# Patient Record
Sex: Female | Born: 1965 | Race: White | Hispanic: No | Marital: Married | Smoking: Former smoker
Health system: Southern US, Community
[De-identification: ages and names within clinical notes are randomized; demographics above are authoritative.]

## PROBLEM LIST (undated history)

## (undated) DIAGNOSIS — E669 Obesity, unspecified: Secondary | ICD-10-CM

## (undated) DIAGNOSIS — E785 Hyperlipidemia, unspecified: Secondary | ICD-10-CM

## (undated) HISTORY — DX: Hyperlipidemia, unspecified: E78.5

## (undated) HISTORY — DX: Obesity, unspecified: E66.9

## (undated) HISTORY — PX: FINGER GANGLION CYST EXCISION: SHX1636

---

## 1999-11-18 ENCOUNTER — Other Ambulatory Visit: Admission: RE | Admit: 1999-11-18 | Discharge: 1999-11-18 | Payer: Self-pay | Admitting: Emergency Medicine

## 2000-11-28 ENCOUNTER — Other Ambulatory Visit: Admission: RE | Admit: 2000-11-28 | Discharge: 2000-11-28 | Payer: Self-pay | Admitting: *Deleted

## 2002-06-20 ENCOUNTER — Other Ambulatory Visit: Admission: RE | Admit: 2002-06-20 | Discharge: 2002-06-20 | Payer: Self-pay | Admitting: *Deleted

## 2004-09-09 ENCOUNTER — Other Ambulatory Visit: Admission: RE | Admit: 2004-09-09 | Discharge: 2004-09-09 | Payer: Self-pay | Admitting: Family Medicine

## 2004-09-28 ENCOUNTER — Ambulatory Visit (HOSPITAL_BASED_OUTPATIENT_CLINIC_OR_DEPARTMENT_OTHER): Admission: RE | Admit: 2004-09-28 | Discharge: 2004-09-28 | Payer: Self-pay | Admitting: Surgery

## 2004-09-28 ENCOUNTER — Ambulatory Visit: Payer: Self-pay | Admitting: Internal Medicine

## 2004-09-29 ENCOUNTER — Encounter: Admission: RE | Admit: 2004-09-29 | Discharge: 2004-12-28 | Payer: Self-pay | Admitting: Surgery

## 2004-10-05 ENCOUNTER — Ambulatory Visit (HOSPITAL_COMMUNITY): Admission: RE | Admit: 2004-10-05 | Discharge: 2004-10-05 | Payer: Self-pay | Admitting: Surgery

## 2004-12-29 ENCOUNTER — Observation Stay (HOSPITAL_COMMUNITY): Admission: RE | Admit: 2004-12-29 | Discharge: 2004-12-30 | Payer: Self-pay | Admitting: Surgery

## 2004-12-29 HISTORY — PX: LAPAROSCOPIC GASTRIC BANDING: SHX1100

## 2006-09-27 HISTORY — PX: WRIST SURGERY: SHX841

## 2007-01-20 ENCOUNTER — Ambulatory Visit (HOSPITAL_COMMUNITY): Admission: RE | Admit: 2007-01-20 | Discharge: 2007-01-20 | Payer: Self-pay | Admitting: *Deleted

## 2007-09-28 HISTORY — PX: BUNIONECTOMY: SHX129

## 2008-01-23 ENCOUNTER — Ambulatory Visit (HOSPITAL_COMMUNITY): Admission: RE | Admit: 2008-01-23 | Discharge: 2008-01-23 | Payer: Self-pay | Admitting: *Deleted

## 2009-02-18 ENCOUNTER — Ambulatory Visit (HOSPITAL_COMMUNITY): Admission: RE | Admit: 2009-02-18 | Discharge: 2009-02-18 | Payer: Self-pay | Admitting: Obstetrics

## 2010-03-03 ENCOUNTER — Ambulatory Visit (HOSPITAL_COMMUNITY): Admission: RE | Admit: 2010-03-03 | Discharge: 2010-03-03 | Payer: Self-pay | Admitting: Obstetrics

## 2011-02-12 NOTE — Procedures (Signed)
NAME:  Joanna Lowe, Joanna Lowe NO.:  [id number]   MEDICAL RECORD NO.:  [id number]          PATIENT TYPE:  OUT   LOCATION:  SLEEP CENTER                 FACILITY:  [facility]   PHYSICIAN:  Clinton D. Maple Hudson, M.D. DATE OF BIRTH:  [birth year]   DATE OF STUDY:  09/28/2004                              NOCTURNAL POLYSOMNOGRAM   INDICATIONS FOR STUDY:  Hypersomnia with sleep apnea. Epworth sleepiness  score of 8/24,  neck size 15 inches, BMI 37.1, weight 210 pounds.   SLEEP ARCHITECTURE:  Short total sleep time of 277 minutes with sleep  efficiency of 67%. Stage I was 17%, stage II was 53%, stages III and IV were  10%, REM was 20% of total sleep time. Latency to sleep onset 9 minutes.  Latency to REM 79 minutes. Awake after sleep onset 102 minutes. Arousal  index 5.4. No medications were taken before sleep.   RESPIRATORY DATA:  RDI 3 obstructive events per hour. This included 14  hypopneas during the sleep time. This is within normal limits. Obstructive  events were only noted while sleeping on her back. REM RDI 13.9 per hour.   OXYGEN DATA:  Very loud snoring with oxygen desaturation to a nadir of 90%.  Mean oxygen saturation through the study was 96% on room air.   CARDIAC DATA:  Normal sinus rhythm.   MOVEMENT/PARASOMNIA:  Occasional leg jerk, insignificant.   IMPRESSION/RECOMMENDATIONS:  Occasional obstructive sleep disordered  breathing events with a frequency (RDI) of only 3 per hour which is within  normal limits for an adult and not indicative obstructive sleep apnea  syndrome. Events were noted while sleeping on her back only. Recommend  weight loss and advised to sleep off of back if appropriate.                                                           Clinton D. Maple Hudson, M.D.  Diplomate, [employer] Board   CDY/MEDQ  D:  10/04/2004 08:36:42  T:  10/04/2004 08:46:56  Job:  [id number]

## 2011-02-12 NOTE — Op Note (Signed)
Joanna Lowe, ELDEN NO.:  [id number]   MEDICAL RECORD NO.:  [id number]          PATIENT TYPE:  OBV   LOCATION:  0098                         FACILITY:  [facility]   PHYSICIAN:  Thornton Park. Daphine Deutscher, MD  DATE OF BIRTH:  1966/06/20   DATE OF PROCEDURE:  12/29/2004  DATE OF DISCHARGE:                                 OPERATIVE REPORT   PREOPERATIVE DIAGNOSIS:  Morbid obesity with body mass index right at 40.   POSTOPERATIVE DIAGNOSIS:  Morbid obesity with body mass index right at 40.   PROCEDURE:  Laparoscopic adjustable gastric banding, 10 cm.   SURGEON:  Thornton Park. Daphine Deutscher, MD   SURGEON:  Vikki Ports, MD   ANESTHESIA:  General endotracheal.   ESTIMATED BLOOD LOSS:  Negligible.   DRAINS:  None.   APPARENT COMPLICATIONS:  None.   DESCRIPTION OF PROCEDURE:  Joanna Lowe is a 45 year old lady who I had  seen and obtained informed consent____________ was obtained regarding  gastric banding.  She is brought to OR #1 and given general anesthesia.  Access of the abdomen was gained through the left upper quadrant using  Optiview technique without difficulty.  Then subsequently two ports were  placed in the left, camera placed below, and a second 5 mm laterally on the  left.  First, the left crus was identified, and an area incised.  The pars  ______flacida_______ was open and down near the fat pad where the right crus  was, I opened an area there and then passed the finger dissector bluntly,  bringing out without difficulty.  The band was then introduced through the  inferior-most port on the right, then introduced without difficulty.  It was  placed in the band passer and pulled around and threaded back through the  buckle and then snapped.  The sizer was passed per mouth and went through  the band easily and pulled back through easily.  The band was now in place,  and the buckle was pulled to the patient's right.  Then three sutures of 2-0  Surgitek were  placed using the regular needle in free-suture technique but  using the tie knot for securing them.  This imbricated the stomach back up  on the proximal stomach.  Everything seemed to be in order, and no bleeding  was noted.  The tip of the band was then brought out through the inferior-  most portal on the right.  This was then cut off and connected to the port.  This was then sutured into the fascia with three interrupted 2-0 Prolene,  securing it.  No  fluid was left in the band.  The wounds were all closed with 4-0 Vicryl and  subcuticularly with Benzoin and Steri-Strips.  The patient seemed to  tolerated the procedure well.  She was taken to the recovery room in  satisfactory condition.      MBM/MEDQ  D:  12/29/2004  T:  12/29/2004  Job:  [id number]   cc:   Holley Bouche, M.D.  [address]  [place], [place] [address]  Fax: [phone]

## 2011-03-25 ENCOUNTER — Other Ambulatory Visit (HOSPITAL_COMMUNITY): Payer: Self-pay | Admitting: Obstetrics

## 2011-03-25 DIAGNOSIS — Z1231 Encounter for screening mammogram for malignant neoplasm of breast: Secondary | ICD-10-CM

## 2011-04-09 ENCOUNTER — Ambulatory Visit (HOSPITAL_COMMUNITY)
Admission: RE | Admit: 2011-04-09 | Discharge: 2011-04-09 | Disposition: A | Discharge status: Home / Self Care | Payer: BC Managed Care – PPO | Source: Ambulatory Visit | Attending: Obstetrics | Admitting: Obstetrics

## 2011-04-09 DIAGNOSIS — Z1231 Encounter for screening mammogram for malignant neoplasm of breast: Secondary | ICD-10-CM

## 2011-05-12 ENCOUNTER — Encounter (INDEPENDENT_AMBULATORY_CARE_PROVIDER_SITE_OTHER): Payer: Self-pay | Admitting: General Surgery

## 2011-05-14 ENCOUNTER — Encounter (INDEPENDENT_AMBULATORY_CARE_PROVIDER_SITE_OTHER): Payer: Self-pay | Admitting: Surgery

## 2011-06-09 ENCOUNTER — Encounter (INDEPENDENT_AMBULATORY_CARE_PROVIDER_SITE_OTHER): Payer: Self-pay | Admitting: Surgery

## 2011-06-09 ENCOUNTER — Ambulatory Visit (INDEPENDENT_AMBULATORY_CARE_PROVIDER_SITE_OTHER): Payer: BC Managed Care – PPO | Admitting: Surgery

## 2011-06-09 DIAGNOSIS — Z9884 Bariatric surgery status: Secondary | ICD-10-CM

## 2011-06-09 NOTE — Patient Instructions (Signed)

## 2011-06-09 NOTE — Progress Notes (Signed)
Joanna Lowe comes in today and she is too tight. She requested that I remove some fluid from her band and I did. Her removed 0.2 cc from her band. I'll see her back in 3 months. In the meantime Hub is doing well

## 2011-09-10 ENCOUNTER — Encounter (INDEPENDENT_AMBULATORY_CARE_PROVIDER_SITE_OTHER): Payer: Self-pay

## 2011-09-10 ENCOUNTER — Encounter (INDEPENDENT_AMBULATORY_CARE_PROVIDER_SITE_OTHER): Payer: Self-pay | Admitting: Surgery

## 2011-09-10 ENCOUNTER — Ambulatory Visit (INDEPENDENT_AMBULATORY_CARE_PROVIDER_SITE_OTHER): Payer: BC Managed Care – PPO | Admitting: Physician Assistant

## 2011-09-10 VITALS — BP 126/84 | HR 70 | Temp 97.6°F | Resp 18 | Ht 62.0 in | Wt 184.2 lb

## 2011-09-10 DIAGNOSIS — Z4651 Encounter for fitting and adjustment of gastric lap band: Secondary | ICD-10-CM

## 2011-09-10 DIAGNOSIS — R1011 Right upper quadrant pain: Secondary | ICD-10-CM

## 2011-09-10 DIAGNOSIS — E669 Obesity, unspecified: Secondary | ICD-10-CM

## 2011-09-10 NOTE — Progress Notes (Signed)
  HISTORY: Joanna Lowe is a 46 y.o.female who received an 10cm lap-band in April 2006 by Dr. Daphine Deutscher. She comes in today complaining about increased hunger and larger portion sizes. No vomiting unless she eats too quickly. Otherwise she doesn't have symptoms of over-restriction. She does relate post-parandial RUQ pain that began earlier in the summer. She's uncertain if it's associated with nausea but she does say it's pretty much after meals. She denies history of a cholecystectomy.  VITAL SIGNS: Filed Vitals:   09/10/11 1520  BP: 126/84  Pulse: 70  Temp: 97.6 F (36.4 C)  Resp: 18    PHYSICAL EXAM: Physical exam reveals a very well-appearing 45 y.o.female in no apparent distress Neurologic: Awake, alert, oriented Psych: Bright affect, conversant Respiratory: Breathing even and unlabored. No stridor or wheezing Abdomen: Soft, nondistended to palpation. Incisions well-healed. No incisional hernias. Port easily palpated. Mild murphy's sign on deep inspiration. No hepatomegaly. No guarding. No tenderness elsewhere in the abdomen Extremities: Atraumatic, good range of motion.  ASSESMENT: 45 y.o.  female  s/p 10cm lap-band. RUQ abdominal pain.  PLAN: The patient's port was accessed with a 20G Huber needle without difficulty. Clear fluid was aspirated and 0.1 mL saline was added to the port. The patient was able to swallow water without difficulty following the procedure and was instructed to take clear liquids for the next 24-48 hours and advance slowly as tolerated. I have ordered a RUQ u/s to investigate the RUQ pain.

## 2011-09-10 NOTE — Patient Instructions (Signed)
Take clear liquids for the next 48 hours. Thin protein shakes are ok to start on Saturday evening. Call us if you have persistent vomiting or regurgitation, night cough or reflux symptoms. Return as scheduled or sooner if you notice no changes in hunger/portion sizes.  Follow-up with radiology for your ultrasound.

## 2011-09-14 ENCOUNTER — Other Ambulatory Visit (INDEPENDENT_AMBULATORY_CARE_PROVIDER_SITE_OTHER): Payer: Self-pay | Admitting: General Surgery

## 2011-09-14 DIAGNOSIS — R1011 Right upper quadrant pain: Secondary | ICD-10-CM

## 2011-09-16 ENCOUNTER — Ambulatory Visit
Admission: RE | Admit: 2011-09-16 | Discharge: 2011-09-16 | Disposition: A | Discharge status: Home / Self Care | Payer: BC Managed Care – PPO | Source: Ambulatory Visit | Attending: Physician Assistant | Admitting: Physician Assistant

## 2011-09-16 ENCOUNTER — Other Ambulatory Visit: Payer: BC Managed Care – PPO

## 2011-09-16 DIAGNOSIS — R1011 Right upper quadrant pain: Secondary | ICD-10-CM

## 2011-09-17 ENCOUNTER — Telehealth (INDEPENDENT_AMBULATORY_CARE_PROVIDER_SITE_OTHER): Payer: Self-pay | Admitting: Surgery

## 2011-09-20 ENCOUNTER — Telehealth (INDEPENDENT_AMBULATORY_CARE_PROVIDER_SITE_OTHER): Payer: Self-pay | Admitting: General Surgery

## 2011-09-22 ENCOUNTER — Telehealth (INDEPENDENT_AMBULATORY_CARE_PROVIDER_SITE_OTHER): Payer: Self-pay | Admitting: General Surgery

## 2011-09-22 NOTE — Telephone Encounter (Signed)
Pt called in stating she had an ultrasound done on 09/25/11. Stated she called previously and left a message but had not received a call back with the information. I advised her that I could not give her the information over the phone as I am not the nurse assigned to that doctor. Pt stated she can be called back on [phone].

## 2011-09-22 NOTE — Telephone Encounter (Signed)
Contacted the patient and discussed negative results with her and her husband. She still believes there is something going on, describes her pain as "a baby kicking around the site of her port" she would like to see if there are any other tests that could be scheduled to see what is going on. Explained I would check with Dr Daphine Deutscher and get back in touch with her.

## 2012-01-31 ENCOUNTER — Encounter (INDEPENDENT_AMBULATORY_CARE_PROVIDER_SITE_OTHER): Payer: Self-pay | Admitting: Surgery

## 2012-02-11 ENCOUNTER — Ambulatory Visit (INDEPENDENT_AMBULATORY_CARE_PROVIDER_SITE_OTHER): Payer: BC Managed Care – PPO | Admitting: Surgery

## 2012-02-11 ENCOUNTER — Encounter (INDEPENDENT_AMBULATORY_CARE_PROVIDER_SITE_OTHER): Payer: Self-pay | Admitting: Surgery

## 2012-02-11 VITALS — BP 114/68 | Ht 62.0 in | Wt 181.0 lb

## 2012-02-11 DIAGNOSIS — K828 Other specified diseases of gallbladder: Secondary | ICD-10-CM

## 2012-02-11 NOTE — Progress Notes (Signed)
Joanna Lowe comes in today who had a long discussion about her lap band which she is actually managing fairly well. She is cooking and eating right. However it is having intermittent severe right upper quadrant pain with some fatty foods and letter based products. Although she had a normal ultrasound back in the last year she's still having pain.  It sounds like she could be having biliary dyskinesia. We will get a CCK stimulated HIDA scan and see her back after that.

## 2012-02-14 ENCOUNTER — Other Ambulatory Visit (INDEPENDENT_AMBULATORY_CARE_PROVIDER_SITE_OTHER): Payer: Self-pay | Admitting: General Surgery

## 2012-02-14 ENCOUNTER — Telehealth (INDEPENDENT_AMBULATORY_CARE_PROVIDER_SITE_OTHER): Payer: Self-pay | Admitting: General Surgery

## 2012-02-14 DIAGNOSIS — K828 Other specified diseases of gallbladder: Secondary | ICD-10-CM

## 2012-02-14 NOTE — Telephone Encounter (Signed)
Called patient to advise that appointment was set up for 02/22/12 at [facility] for test requested by Dr.Martin. Her follow up appointment is 03/02/12 at 10:140. Copy of test ordered mailed to patient with date of appointment with Daphine Deutscher noted.

## 2012-02-22 ENCOUNTER — Encounter (HOSPITAL_COMMUNITY)
Admission: RE | Admit: 2012-02-22 | Discharge: 2012-02-22 | Disposition: A | Discharge status: Home / Self Care | Payer: BC Managed Care – PPO | Source: Ambulatory Visit | Attending: Surgery | Admitting: Surgery

## 2012-02-22 ENCOUNTER — Telehealth (INDEPENDENT_AMBULATORY_CARE_PROVIDER_SITE_OTHER): Payer: Self-pay | Admitting: General Surgery

## 2012-02-22 DIAGNOSIS — K9089 Other intestinal malabsorption: Secondary | ICD-10-CM | POA: Insufficient documentation

## 2012-02-22 DIAGNOSIS — K828 Other specified diseases of gallbladder: Secondary | ICD-10-CM

## 2012-02-22 DIAGNOSIS — R109 Unspecified abdominal pain: Secondary | ICD-10-CM | POA: Insufficient documentation

## 2012-02-22 DIAGNOSIS — R110 Nausea: Secondary | ICD-10-CM | POA: Insufficient documentation

## 2012-02-22 MED ORDER — TECHNETIUM TC 99M MEBROFENIN IV KIT
5.0000 | PACK | Freq: Once | INTRAVENOUS | Status: AC | PRN
  Administered 2012-02-22: 5 via INTRAVENOUS

## 2012-02-22 MED ORDER — SINCALIDE 5 MCG IJ SOLR
INTRAMUSCULAR | Status: AC
  Administered 2012-02-22: 1.62 ug
  Filled 2012-02-22: qty 5

## 2012-02-22 NOTE — Telephone Encounter (Signed)
Pt has just come home from her nuc med scan.  She now has diarrhea and nausea/vomiting.  Advised she can try Immodium, but there is a "GI virus" in the area which she may have gotten.  Recommended she push po fluids AMAP, especially Gatorade.

## 2012-03-02 ENCOUNTER — Ambulatory Visit (INDEPENDENT_AMBULATORY_CARE_PROVIDER_SITE_OTHER): Payer: BC Managed Care – PPO | Admitting: Surgery

## 2012-03-02 ENCOUNTER — Encounter (INDEPENDENT_AMBULATORY_CARE_PROVIDER_SITE_OTHER): Payer: Self-pay | Admitting: Surgery

## 2012-03-02 VITALS — BP 116/70 | HR 66 | Temp 98.0°F | Resp 12 | Ht 62.0 in | Wt 181.5 lb

## 2012-03-02 DIAGNOSIS — K562 Volvulus: Secondary | ICD-10-CM

## 2012-03-02 NOTE — Progress Notes (Signed)
Joanna Lowe 46 y.o.  Body mass index is 33.20 kg/(m^2).  There is no problem list on file for this patient.   No Known Allergies  Past Surgical History  Procedure Date  . Bunionectomy 2009    right foot  . Laparoscopic gastric banding 12/29/2004  . Wrist surgery 2008    left  . Finger ganglion cyst excision     left ring finger   Joanna Blamer, MD, MD No diagnosis found.  Joanna Lowe in today after her HIDA scan which with CCK was totally normal. She had a normal ejection fraction, a patent cystic duct and the ejection to her common bile duct was normal. Her symptoms of right upper quadrant pain occurs sometimes after she vomits. She also has periods of constipation. I wonder if she's having some problems with her cecum and ascending colon. I will get an air contrast barium enema to rule out a cecal bascule. Joanna B. Daphine Deutscher, MD, [facility] [phone] beeper [phone]  03/02/2012 11:35 AM

## 2012-03-08 ENCOUNTER — Other Ambulatory Visit (HOSPITAL_COMMUNITY): Payer: BC Managed Care – PPO

## 2012-03-28 ENCOUNTER — Other Ambulatory Visit (HOSPITAL_COMMUNITY): Payer: BC Managed Care – PPO

## 2012-04-25 ENCOUNTER — Ambulatory Visit (HOSPITAL_COMMUNITY)
Admission: RE | Admit: 2012-04-25 | Discharge: 2012-04-25 | Disposition: A | Discharge status: Home / Self Care | Payer: BC Managed Care – PPO | Source: Ambulatory Visit | Attending: Surgery | Admitting: Surgery

## 2012-04-25 DIAGNOSIS — R109 Unspecified abdominal pain: Secondary | ICD-10-CM | POA: Insufficient documentation

## 2012-04-25 DIAGNOSIS — K562 Volvulus: Secondary | ICD-10-CM

## 2012-04-25 DIAGNOSIS — Z9884 Bariatric surgery status: Secondary | ICD-10-CM | POA: Insufficient documentation

## 2013-02-26 ENCOUNTER — Other Ambulatory Visit (HOSPITAL_COMMUNITY): Payer: Self-pay | Admitting: Obstetrics

## 2013-02-26 DIAGNOSIS — Z1231 Encounter for screening mammogram for malignant neoplasm of breast: Secondary | ICD-10-CM

## 2013-03-02 ENCOUNTER — Ambulatory Visit (HOSPITAL_COMMUNITY): Payer: BC Managed Care – PPO

## 2013-04-03 ENCOUNTER — Ambulatory Visit (HOSPITAL_COMMUNITY)
Admission: RE | Admit: 2013-04-03 | Discharge: 2013-04-03 | Disposition: A | Discharge status: Home / Self Care | Payer: BC Managed Care – PPO | Source: Ambulatory Visit | Attending: Obstetrics | Admitting: Obstetrics

## 2013-04-03 DIAGNOSIS — Z1231 Encounter for screening mammogram for malignant neoplasm of breast: Secondary | ICD-10-CM | POA: Insufficient documentation

## 2013-04-19 ENCOUNTER — Encounter (INDEPENDENT_AMBULATORY_CARE_PROVIDER_SITE_OTHER): Payer: Self-pay

## 2013-04-19 ENCOUNTER — Ambulatory Visit (INDEPENDENT_AMBULATORY_CARE_PROVIDER_SITE_OTHER): Payer: BC Managed Care – PPO | Admitting: Physician Assistant

## 2013-04-19 VITALS — BP 132/88 | HR 60 | Temp 97.1°F | Resp 14 | Ht 62.5 in | Wt 181.8 lb

## 2013-04-19 DIAGNOSIS — Z4651 Encounter for fitting and adjustment of gastric lap band: Secondary | ICD-10-CM

## 2013-04-19 DIAGNOSIS — Z9884 Bariatric surgery status: Secondary | ICD-10-CM

## 2013-04-19 NOTE — Progress Notes (Signed)
  HISTORY: Joanna Lowe is a 47 y.o.female who received an 10cm lap-band in April 2006 by Dr. Daphine Deutscher. She comes in with stable weight since her last visit with Dr. Daphine Deutscher but no progress in weight loss. She's increased her exercise but is seeing little result. She does have hunger but says her portion sizes haven't changed considerably. She denies persistent regurgitation or reflux. She would like a fill today to get some more weight off.  VITAL SIGNS: Filed Vitals:   04/19/13 0952  BP: 132/88  Pulse: 60  Temp: 97.1 F (36.2 C)  Resp: 14    PHYSICAL EXAM: Physical exam reveals a very well-appearing 47 y.o.female in no apparent distress Neurologic: Awake, alert, oriented Psych: Bright affect, conversant Respiratory: Breathing even and unlabored. No stridor or wheezing Abdomen: Soft, nontender, nondistended to palpation. Incisions well-healed. No incisional hernias. Port easily palpated. Extremities: Atraumatic, good range of motion.  ASSESMENT: 47 y.o.  female  s/p 10cm lap-band.   PLAN: The patient's port was accessed with a 20G Huber needle without difficulty. Clear fluid was aspirated and 0.2 mL saline was added to the port. The patient was able to swallow water without difficulty following the procedure and was instructed to take clear liquids for the next 24-48 hours and advance slowly as tolerated.

## 2013-04-19 NOTE — Patient Instructions (Signed)

## 2013-04-26 ENCOUNTER — Encounter (INDEPENDENT_AMBULATORY_CARE_PROVIDER_SITE_OTHER): Payer: BC Managed Care – PPO

## 2013-05-24 ENCOUNTER — Encounter: Payer: Self-pay | Admitting: Dietician

## 2013-05-24 ENCOUNTER — Encounter: Payer: BC Managed Care – PPO | Attending: Obstetrics | Admitting: Dietician

## 2013-05-24 VITALS — Ht 62.0 in | Wt 183.4 lb

## 2013-05-24 DIAGNOSIS — Z713 Dietary counseling and surveillance: Secondary | ICD-10-CM | POA: Insufficient documentation

## 2013-05-24 DIAGNOSIS — E669 Obesity, unspecified: Secondary | ICD-10-CM

## 2013-05-24 DIAGNOSIS — Z9884 Bariatric surgery status: Secondary | ICD-10-CM | POA: Insufficient documentation

## 2013-05-24 NOTE — Patient Instructions (Addendum)
Goals:  Follow Bariatric Surgery Specialized Post-Op Diet (ideas)  Aim for maximum of 15 grams of carbs per meal/10-15 grams per snack  Avoid white starches and starchy veggies (potatoes, peas, corn, etc)  Avoid sweetened and alcoholic drinks (for now)  Eat 3-6 small meals/snacks, every 3-5 hrs  No meal skipping  Increase lean protein foods to meet 60-80g goal  Always have a protein source with carbs  Increase fluid intake to 64oz +  Aim for >30 min of physical activity daily   Resume daily vitamin supplementation   *(1) complete multivitamin with iron and 2-3 doses of 500 mg calcium citrate  * Make sure to separate the multivitamin and calcium by 2 hours to prevent iron/calcium from binding together and leaving your body  * Make sure to take your calcium in 3 separate doses because your body cannot absorb more than 500 mg at a time

## 2013-05-24 NOTE — Progress Notes (Signed)
  Medical Nutrition Therapy:  Appt start time: 0900 end time:  1000.   Assessment:  Primary concerns today: Joanna Lowe is here today to help with cholesterol and lose 25 pounds. Had a LAGB in 2006 lost 80 pounds initially. Gained 25 pounds gradually since 2008 when she quit smoking. Had a fill acouple weeks ago and has not lost weight since then.   Will not eat seafood since husband doesn't like it but will order in a restaurant. Can't tolerate much food in the morning, raw vegetables, and most fruits. Does not drink carbonated beverages. Food "comes back up" about 3 x times which is usually if she doesn't chew food enough.   Currently drinking wine or a cocktail several nights a week. Exercising 4-6 x week.   MEDICATIONS: see list, taking supplements   DIETARY INTAKE:  24-hr recall:  B ( AM): coffee with half and half and a egg or leftover chicken (skips 4 x week) Snk ( 11 AM): handful of almonds L (12-1 PM): soup OR grilled chicken with spinach, mushroom, and goat cheese with red pepper dressing Snk ( PM): almonds OR cheese stick OR popcorn D ( PM): chicken OR pasta with red sauce OR grilled chicken salad  Snk ( PM): water and diet green tea or unsweet tea (not drinking during meals) Beverages: glass of wine or 2, martini on the weekend 2 non-fat lattes per week  Usual physical activity: walk 40-45 minutes 4 x week and kickboxing class 2 x week  Estimated energy needs: 1000-1200 calories <100 g carbohydrates 80 g protein 40 g fat  Progress Towards Goal(s):  In progress.   Nutritional Diagnosis:  [place]-3.3 Overweight/obesity related to past poor dietary habits as evidenced by patient w/ recent LAGB surgery following dietary guidelines for continued weight loss.    Intervention:  Nutrition counseling provided. Recommended following jump starting her weight loss by increasing her fluid intake, having a protein shake 2-3 x day instead of a breakfast or a snack, cutting out alcohol, and  eating primarily proteins +NS vegetables with minimal carbohydrates. Recommended limiting saturated fats to help reduce cholesterol.   Goals:  Follow Bariatric Surgery Specialized Post-Op Diet (ideas)  Aim for maximum of 15 grams of carbs per meal/10-15 grams per snack  Avoid white starches and starchy veggies (potatoes, peas, corn, etc)  Avoid sweetened and alcoholic drinks (for now)  Eat 3-6 small meals/snacks, every 3-5 hrs  No meal skipping  Increase lean protein foods to meet 60-80g goal  Always have a protein source with carbs  Increase fluid intake to 64oz +  Aim for >30 min of physical activity daily   Resume daily vitamin supplementation   *(1) complete multivitamin with iron and 2-3 doses of 500 mg calcium citrate  * Make sure to separate the multivitamin and calcium by 2 hours to prevent iron/calcium from binding together and leaving your body  * Make sure to take your calcium in 3 separate doses because your body cannot absorb more than 500 mg at a time  Handouts given during visit include:  Bariatric Specialized Post Op diet  15g CHO Snacks  Protein Shake  Monitoring/Evaluation:  Dietary intake, exercise, lap band fills, and body weight in 6 week(s).

## 2013-07-04 ENCOUNTER — Telehealth (INDEPENDENT_AMBULATORY_CARE_PROVIDER_SITE_OTHER): Payer: Self-pay | Admitting: General Surgery

## 2013-07-04 NOTE — Telephone Encounter (Signed)
Lap band pt of MM called to report she has developed heartburn and is taking Tums and Rolaids to control it.  Recommended omeprazole BID x 7-14 days, then decrease to QD.  If no improvement in next 3-4 weeks, need to call for appt to be evaluated.  Pt states she has taken this medication in the past and will restart it.  Understands all instructions.

## 2013-07-05 ENCOUNTER — Ambulatory Visit: Payer: BC Managed Care – PPO | Admitting: Dietician

## 2013-08-27 ENCOUNTER — Encounter: Payer: Self-pay | Admitting: Podiatry

## 2013-08-27 ENCOUNTER — Ambulatory Visit (INDEPENDENT_AMBULATORY_CARE_PROVIDER_SITE_OTHER): Payer: BC Managed Care – PPO | Admitting: Podiatry

## 2013-08-27 VITALS — BP 112/63 | HR 69 | Resp 18

## 2013-08-27 DIAGNOSIS — Q828 Other specified congenital malformations of skin: Secondary | ICD-10-CM

## 2013-08-27 NOTE — Progress Notes (Signed)
   Subjective:    Patient ID: Joanna Lowe, female    DOB: October 17, 1965, 47 y.o.   MRN: [id number]  HPI my second toe on my right foot has a place on it and i have been picking at it and been going on for about 2 months and then he normally trims my calluses on both my feet Patient presents at her demand for debridement of painful porokeratoses.   Review of Systems  Constitutional: Negative.   HENT: Negative.   Eyes: Negative.   Respiratory: Negative.   Cardiovascular: Negative.   Gastrointestinal: Negative.   Endocrine: Negative.   Genitourinary: Negative.   Musculoskeletal: Negative.   Skin: Negative.   Allergic/Immunologic: Negative.   Neurological: Negative.   Hematological: Negative.   Psychiatric/Behavioral: Negative.        Objective:   Physical Exam  Nucleated keratoses plantar fourth right MPJ noted. Slight keratoses second and third MPJ non-nucleated. Small keratoses medial border second right toe.       Assessment & Plan:   Assessment: Porokeratoses x1 plantar right foot Keratoses right and left feet  Plan: Keratoses on the plantar right debrided and packed with salinocaine. Remaining keratoses are debrided. Reappoint at patient's request

## 2014-01-21 ENCOUNTER — Encounter: Payer: Self-pay | Admitting: Podiatry

## 2014-01-21 ENCOUNTER — Ambulatory Visit (INDEPENDENT_AMBULATORY_CARE_PROVIDER_SITE_OTHER): Payer: BC Managed Care – PPO | Admitting: Podiatry

## 2014-01-21 VITALS — BP 131/77 | HR 65 | Resp 18

## 2014-01-21 DIAGNOSIS — Q828 Other specified congenital malformations of skin: Secondary | ICD-10-CM

## 2014-01-21 NOTE — Progress Notes (Signed)
Patient ID: Joanna Lowe, female   DOB: 27-Aug-1966, 48 y.o.   MRN: [id number]  Subjective: This patient presents for ongoing care for painful skin lesions.  Objective: Nucleated plantar keratoses right. Small nucleated keratoses medial second right toe.  Assessment: Porokeratoses x1 Keratoses x1  Plan: Porokeratoses and keratoses debrided without any bleeding. Both lesions are packed with salinocaine. Reappoint at patient's request

## 2014-05-21 ENCOUNTER — Telehealth (INDEPENDENT_AMBULATORY_CARE_PROVIDER_SITE_OTHER): Payer: Self-pay

## 2014-05-21 NOTE — Telephone Encounter (Signed)
Pt s/p lap band in 2006. Pt states that she is scheduled to have rotator cuff surgery on 06/17/14. Pt was calling to see what type of pain medications she should take due to her lao band. Advised pt that she can having anything as long as it is not a NSAID or aspirin. Advised her that she can take Tylenol. Pt states that Hyrdrocodone normally make her 'loopy" and she wanted to know if Dr Daphine Deutscher suggested anything different. Informed pt that I would send Dr Daphine Deutscher a message and we will contact her as soon as we receive a message. Pt verbalized understanding

## 2014-11-25 ENCOUNTER — Ambulatory Visit (INDEPENDENT_AMBULATORY_CARE_PROVIDER_SITE_OTHER): Payer: BLUE CROSS/BLUE SHIELD | Admitting: Podiatry

## 2014-11-25 DIAGNOSIS — Q828 Other specified congenital malformations of skin: Secondary | ICD-10-CM

## 2014-11-26 NOTE — Progress Notes (Signed)
Patient ID: Joanna Lowe, female   DOB: [birth year], 49 y.o.   MRN: [id number]  Subjective: Patient complaining of painful plantar skin lesion right  Objective: Nucleated plantar keratoses right Punctate keratoses distal second right toe  Assessment: Porokeratosis 1  Plan: Debrided keratoses without a bleeding Apply salinocaine to the plantar right  Reappoint at patient's request

## 2014-12-09 ENCOUNTER — Ambulatory Visit: Payer: Self-pay | Admitting: Podiatry

## 2015-02-10 ENCOUNTER — Ambulatory Visit (INDEPENDENT_AMBULATORY_CARE_PROVIDER_SITE_OTHER): Payer: BLUE CROSS/BLUE SHIELD | Admitting: Podiatry

## 2015-02-10 VITALS — BP 128/74 | HR 62 | Resp 16

## 2015-02-10 DIAGNOSIS — Q828 Other specified congenital malformations of skin: Secondary | ICD-10-CM

## 2015-02-10 NOTE — Progress Notes (Signed)
Patient ID: Sallee LangeDaune B Urbanek, female   DOB: September 10, 1966, 49 y.o.   MRN: [id number]  Subjective: Patient presents for painful nucleated keratoses plantar right and distal second left toe.  Objective: Nucleated plantar keratoses plantar right Slight keratoses nonnucleated distal second left  Assessment: Porokeratosis 1 Keratoses 1  Plan: Debridement of oral keratoses and apply salinocaine Debrided keratoses 1 left  Reappoint at patient's

## 2017-02-25 ENCOUNTER — Other Ambulatory Visit: Payer: Self-pay | Admitting: Surgery

## 2017-02-25 DIAGNOSIS — Z9884 Bariatric surgery status: Secondary | ICD-10-CM

## 2017-02-28 ENCOUNTER — Ambulatory Visit
Admission: RE | Admit: 2017-02-28 | Discharge: 2017-02-28 | Disposition: A | Discharge status: Home / Self Care | Payer: BLUE CROSS/BLUE SHIELD | Source: Ambulatory Visit | Attending: Surgery | Admitting: Surgery

## 2017-02-28 DIAGNOSIS — Z9884 Bariatric surgery status: Secondary | ICD-10-CM

## 2017-02-28 MED ORDER — IOPAMIDOL (ISOVUE-300) INJECTION 61%
100.0000 mL | Freq: Once | INTRAVENOUS | Status: AC | PRN
Start: 2017-02-28 — End: 2017-02-28
  Administered 2017-02-28: 100 mL via INTRAVENOUS

## 2019-04-20 ENCOUNTER — Other Ambulatory Visit: Payer: Self-pay | Admitting: Obstetrics

## 2019-04-20 DIAGNOSIS — R928 Other abnormal and inconclusive findings on diagnostic imaging of breast: Secondary | ICD-10-CM

## 2019-04-20 DIAGNOSIS — N6320 Unspecified lump in the left breast, unspecified quadrant: Secondary | ICD-10-CM

## 2019-04-26 ENCOUNTER — Other Ambulatory Visit: Payer: Self-pay

## 2019-04-26 ENCOUNTER — Ambulatory Visit
Admission: RE | Admit: 2019-04-26 | Discharge: 2019-04-26 | Disposition: A | Discharge status: Home / Self Care | Payer: BLUE CROSS/BLUE SHIELD | Source: Ambulatory Visit | Attending: Obstetrics | Admitting: Obstetrics

## 2019-04-26 ENCOUNTER — Ambulatory Visit
Admission: RE | Admit: 2019-04-26 | Discharge: 2019-04-26 | Disposition: A | Discharge status: Home / Self Care | Payer: PRIVATE HEALTH INSURANCE | Source: Ambulatory Visit | Attending: Obstetrics | Admitting: Obstetrics

## 2019-04-26 DIAGNOSIS — N6320 Unspecified lump in the left breast, unspecified quadrant: Secondary | ICD-10-CM

## 2019-04-26 DIAGNOSIS — R928 Other abnormal and inconclusive findings on diagnostic imaging of breast: Secondary | ICD-10-CM

## 2021-02-06 IMAGING — US ULTRASOUND LEFT BREAST LIMITED
1 series · 5 of 5 positions shown · non-contrast
Comparison: Previous exam(s).

CLINICAL DATA: Screening recall for a left breast mass.

EXAM:
ULTRASOUND OF THE LEFT BREAST

[Series 1: ultrasound left breast limited · 0.06mm/px · 5 of 5 slices shown]
[im 1/5]
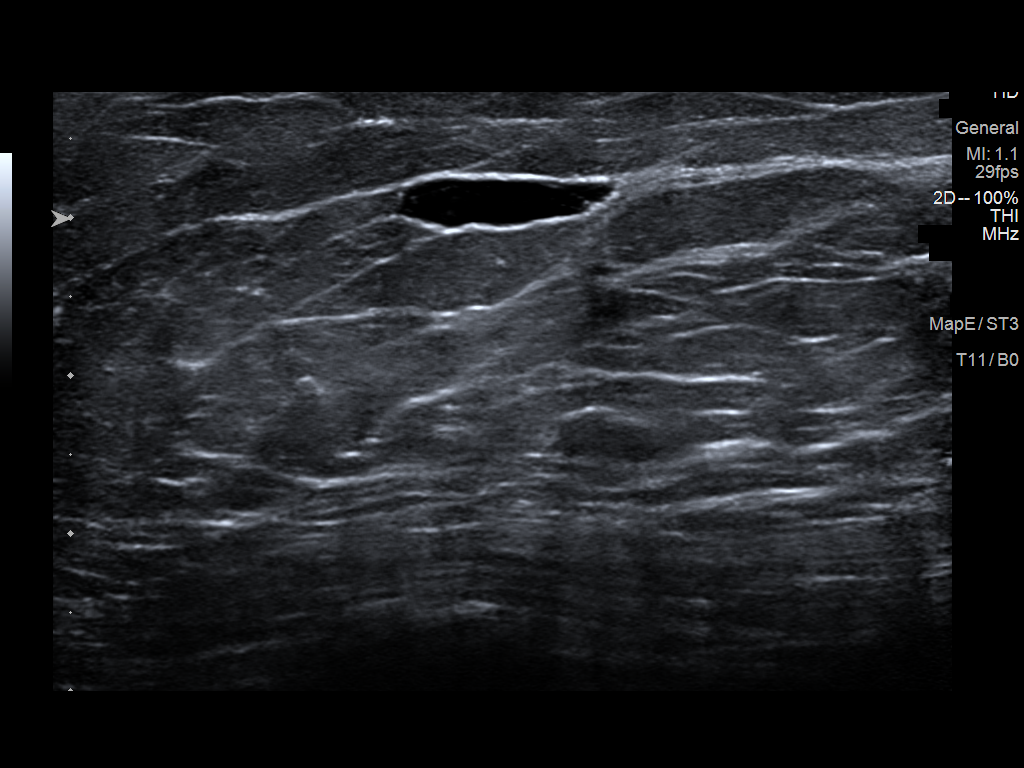
[im 2/5]
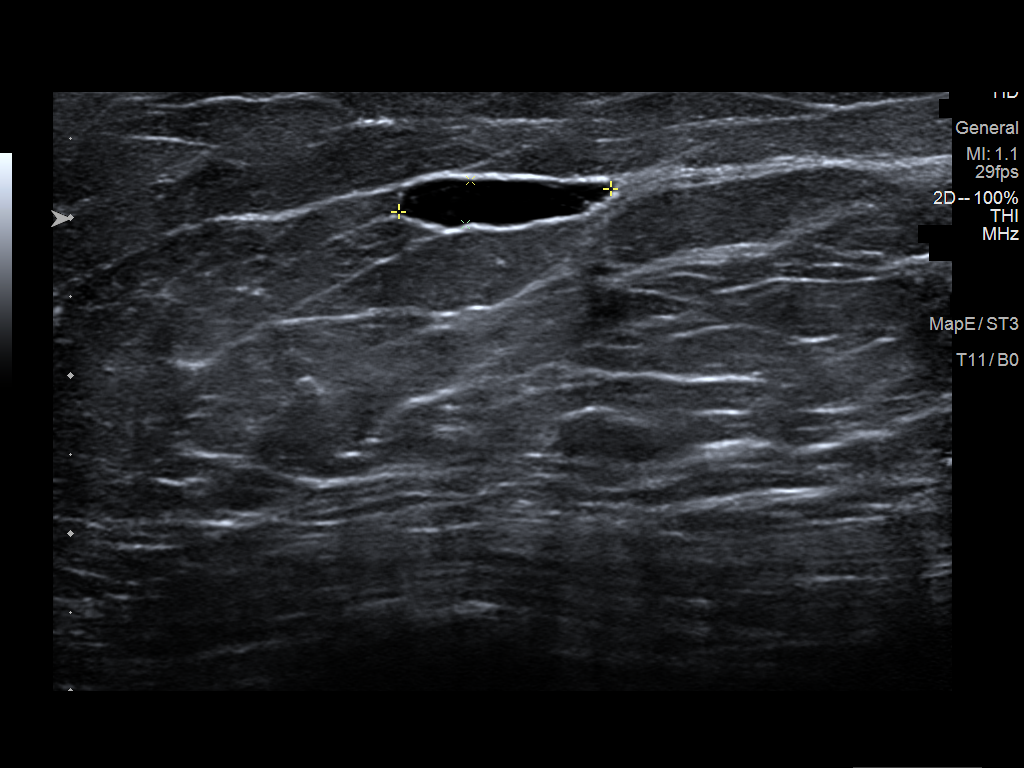
[im 3/5]
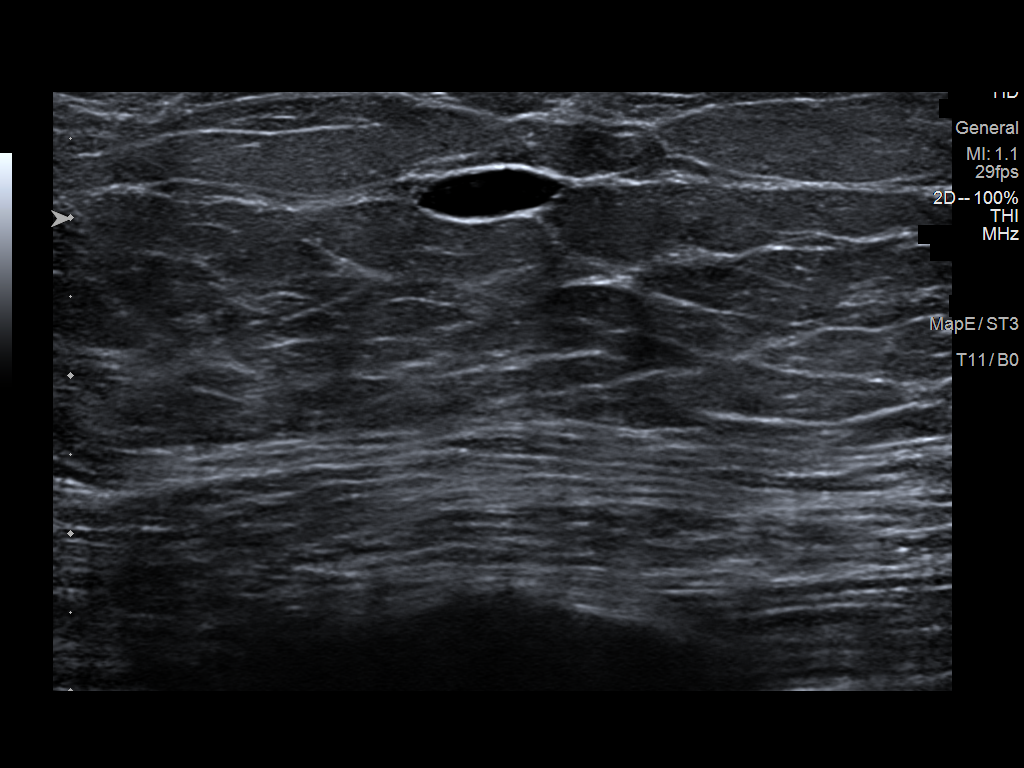
[im 4/5]
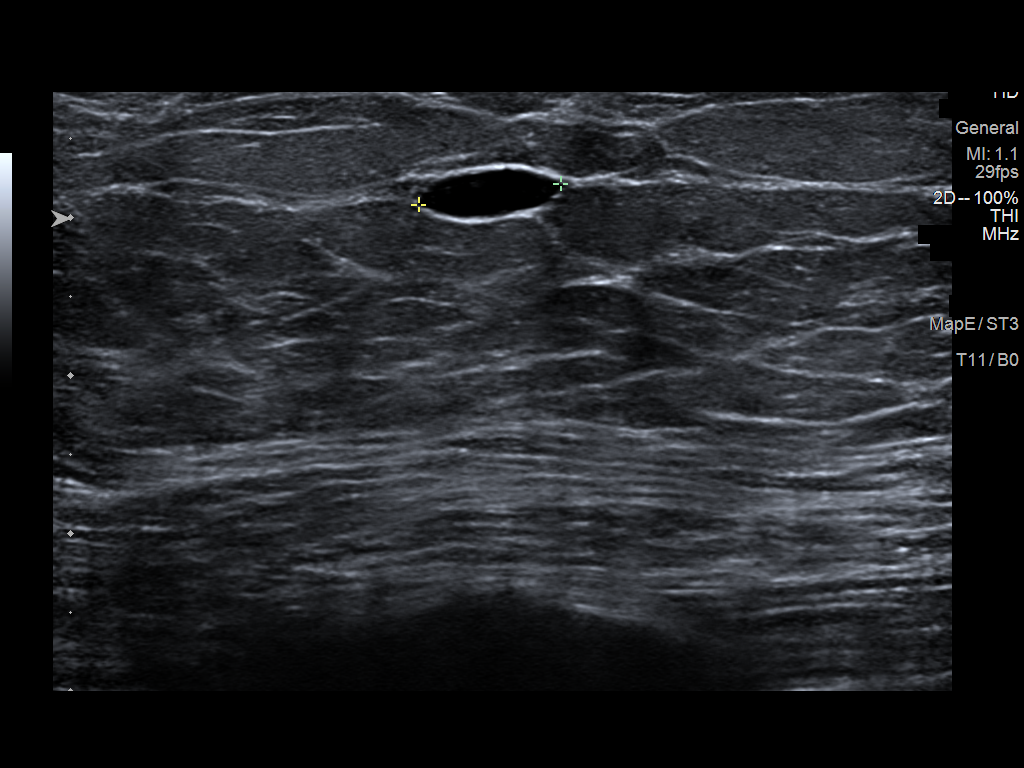
[im 5/5]
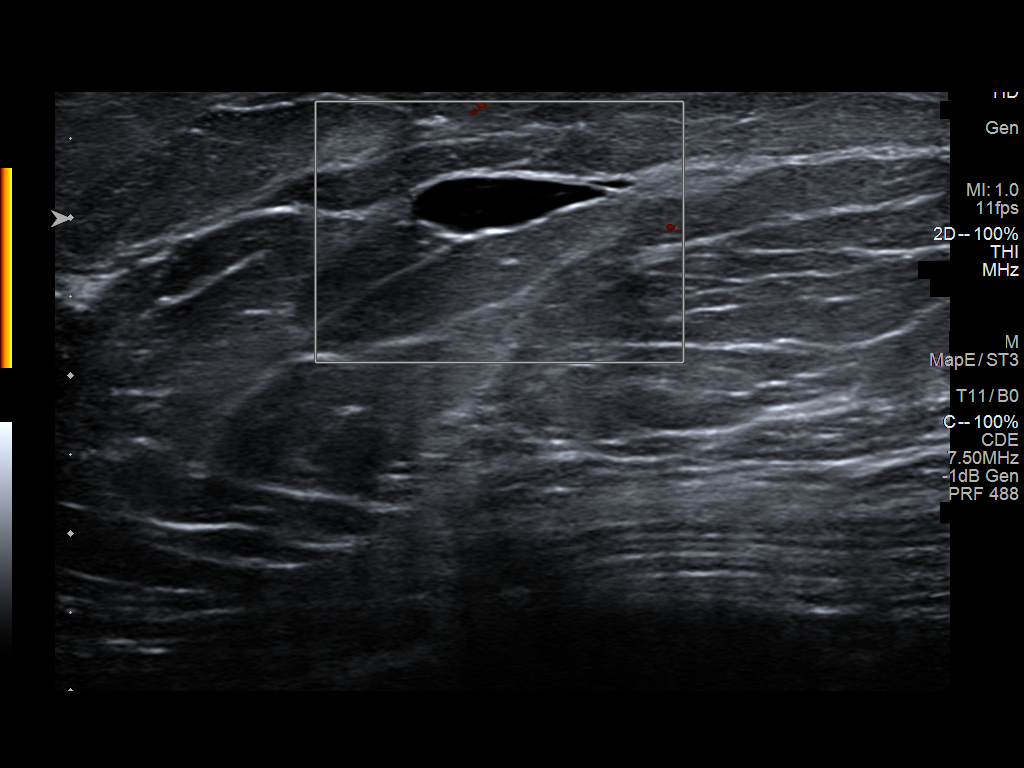

[5 of 5 positions shown; findings below may reference images not displayed]

FINDINGS: Ultrasound of the left breast at 1 o'clock, 4 cm from the nipple
demonstrates an anechoic oval circumscribed mass measuring 1.4 x
x 0.9 cm.
IMPRESSION: The left breast mass at 1 o'clock corresponds with a benign cyst.

RECOMMENDATION:
Screening mammogram in one year.(Code:G5-9-DTG)

I have discussed the findings and recommendations with the patient.
Results were also provided in writing at the conclusion of the
visit. If applicable, a reminder letter will be sent to the patient
regarding the next appointment.

BI-RADS CATEGORY  2: Benign.
